# Patient Record
Sex: Male | Born: 1995 | Hispanic: Yes | Marital: Single | Smoking: Never smoker
Health system: Southern US, Community
[De-identification: ages and names within clinical notes are randomized; demographics above are authoritative.]

---
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
## 2017-06-14 ENCOUNTER — Other Ambulatory Visit: Payer: Self-pay

## 2017-06-14 ENCOUNTER — Emergency Department
Admission: EM | Admit: 2017-06-14 | Discharge: 2017-06-14 | Disposition: A | Discharge status: Home / Self Care | Payer: Self-pay | Attending: Emergency Medicine | Admitting: Emergency Medicine

## 2017-06-14 ENCOUNTER — Emergency Department: Payer: Self-pay

## 2017-06-14 ENCOUNTER — Encounter: Payer: Self-pay | Admitting: Emergency Medicine

## 2017-06-14 DIAGNOSIS — N50811 Right testicular pain: Secondary | ICD-10-CM | POA: Insufficient documentation

## 2017-06-14 DIAGNOSIS — A549 Gonococcal infection, unspecified: Secondary | ICD-10-CM | POA: Insufficient documentation

## 2017-06-14 DIAGNOSIS — R109 Unspecified abdominal pain: Secondary | ICD-10-CM | POA: Insufficient documentation

## 2017-06-14 LAB — URINALYSIS, COMPLETE (UACMP) WITH MICROSCOPIC
BILIRUBIN URINE: NEGATIVE
GLUCOSE, UA: NEGATIVE mg/dL
Ketones, ur: NEGATIVE mg/dL
Nitrite: POSITIVE — AB
PH: 7 (ref 5.0–8.0)
Protein, ur: NEGATIVE mg/dL
SPECIFIC GRAVITY, URINE: 1.011 (ref 1.005–1.030)
Squamous Epithelial / LPF: NONE SEEN (ref 0–5)

## 2017-06-14 LAB — CHLAMYDIA/NGC RT PCR (ARMC ONLY)
CHLAMYDIA TR: NOT DETECTED
N GONORRHOEAE: DETECTED — AB

## 2017-06-14 LAB — CBC WITH DIFFERENTIAL/PLATELET
Basophils Absolute: 0 10*3/uL (ref 0–0.1)
Basophils Relative: 0 %
EOS ABS: 0.3 10*3/uL (ref 0–0.7)
EOS PCT: 3 %
HCT: 41.7 % (ref 40.0–52.0)
Hemoglobin: 14.6 g/dL (ref 13.0–18.0)
LYMPHS PCT: 26 %
Lymphs Abs: 2.4 10*3/uL (ref 1.0–3.6)
MCH: 29.4 pg (ref 26.0–34.0)
MCHC: 35 g/dL (ref 32.0–36.0)
MCV: 84.1 fL (ref 80.0–100.0)
MONO ABS: 0.9 10*3/uL (ref 0.2–1.0)
MONOS PCT: 10 %
Neutro Abs: 5.4 10*3/uL (ref 1.4–6.5)
Neutrophils Relative %: 61 %
Platelets: 203 10*3/uL (ref 150–440)
RBC: 4.96 MIL/uL (ref 4.40–5.90)
RDW: 13.1 % (ref 11.5–14.5)
WBC: 9 10*3/uL (ref 3.8–10.6)

## 2017-06-14 LAB — COMPREHENSIVE METABOLIC PANEL
ALT: 45 U/L (ref 17–63)
ANION GAP: 10 (ref 5–15)
AST: 37 U/L (ref 15–41)
Albumin: 4.2 g/dL (ref 3.5–5.0)
Alkaline Phosphatase: 73 U/L (ref 38–126)
BUN: 10 mg/dL (ref 6–20)
CO2: 25 mmol/L (ref 22–32)
CREATININE: 0.85 mg/dL (ref 0.61–1.24)
Calcium: 9.3 mg/dL (ref 8.9–10.3)
Chloride: 103 mmol/L (ref 101–111)
Glucose, Bld: 123 mg/dL — ABNORMAL HIGH (ref 65–99)
Potassium: 3.6 mmol/L (ref 3.5–5.1)
SODIUM: 138 mmol/L (ref 135–145)
Total Bilirubin: 0.5 mg/dL (ref 0.3–1.2)
Total Protein: 7.5 g/dL (ref 6.5–8.1)

## 2017-06-14 MED ORDER — CEFTRIAXONE SODIUM 250 MG IJ SOLR
250.0000 mg | Freq: Once | INTRAMUSCULAR | Status: AC
  Administered 2017-06-14: 250 mg via INTRAMUSCULAR
  Filled 2017-06-14: qty 250

## 2017-06-14 MED ORDER — AZITHROMYCIN 500 MG PO TABS
ORAL_TABLET | ORAL | Status: AC
  Filled 2017-06-14: qty 2

## 2017-06-14 MED ORDER — AZITHROMYCIN 1 G PO PACK
1.0000 g | PACK | Freq: Once | ORAL | Status: DC
  Filled 2017-06-14: qty 1

## 2017-06-14 MED ORDER — AZITHROMYCIN 500 MG PO TABS
1000.0000 mg | ORAL_TABLET | Freq: Once | ORAL | Status: AC
  Administered 2017-06-14: 1000 mg via ORAL

## 2017-06-14 NOTE — ED Notes (Signed)
Dr. Brown at bedside

## 2017-06-14 NOTE — ED Provider Notes (Signed)
I assumed care of the patient from Dr. Juliette AlcideMelinda at 1:00 PM.  Gonorrhea chlamydia was pending at that time which was shortly resulted thereafter which revealed that the patient was positive for gonorrhea.  CT scan was also performed which was unremarkable.  I spoke with the patient at length regarding gonorrhea urethritis and stressed the importance that he inform all sexual partners of this diagnosis.  Patient received IM ceftriaxone 250 mg as well as azithromycin 1 g.   Darci CurrentBrown, [place] N, MD 06/14/17 [phone]

## 2017-06-14 NOTE — ED Provider Notes (Signed)
[facility] Emergency Department Provider Note   ____________________________________________   First MD Initiated Contact with Patient 06/14/17 2217     (approximate)  I have reviewed the triage vital signs and the nursing notes.   HISTORY  Chief Complaint Dysuria    HPI Chrisangel Eskenazi is a 22 y.o. male who reports he was at work and went to urinate and had sudden onset of pain in his right testicle and dysuria.  He has had intermittent episodes of pain shooting down from the right side of his lower abdomen into the testicle and continues to have pain in the right groin.  He also has dysuria has had some fever with it.  He does not have any pain in the testicle now there is no swelling there episodes of pain were brief and went away.   History reviewed. No pertinent past medical history.  There are no active problems to display for this patient.   History reviewed. No pertinent surgical history.  Prior to Admission medications   Not on File    Allergies Penicillins  No family history on file.  Social History Social History   Tobacco Use  . Smoking status: Never Smoker  . Smokeless tobacco: Never Used  Substance Use Topics  . Alcohol use: No  . Drug use: Not on file    Review of Systems  Constitutional:  fever/chills Eyes: No visual changes. ENT: No sore throat. Cardiovascular: Denies chest pain. Respiratory: Denies shortness of breath. Gastrointestinal:abdominal pain see HPI.  No nausea, no vomiting.  No diarrhea.  No constipation. Genitourinary: dysuria. Musculoskeletal: Negative for back pain. Skin: Negative for rash. Neurological: Negative for headaches, focal weakness ____________________________________________   PHYSICAL EXAM:  VITAL SIGNS: ED Triage Vitals  Enc Vitals Group     BP 06/14/17 2053 126/83     Pulse Rate 06/14/17 2147 85     Resp 06/14/17 2053 18     Temp 06/14/17 2053 98.3 F (36.8 C)   Temp Source 06/14/17 2053 Oral     SpO2 06/14/17 2053 97 %     Weight 06/14/17 2055 160 lb (72.6 kg)     Height 06/14/17 2055 5\' 6"  (1.676 m)     Head Circumference --      Peak Flow --      Pain Score 06/14/17 2053 10     Pain Loc --      Pain Edu? --      Excl. in GC? --     Constitutional: Alert and oriented. Well appearing and in no acute distress. Eyes: Conjunctivae are normal.  Head: Atraumatic. Nose: No congestion/rhinnorhea. Mouth/Throat: Mucous membranes are moist.  No erythema or exudate cardiovascular: Normal rate, regular rhythm. Grossly normal heart sounds.  Good peripheral circulation. Respiratory: Normal respiratory effort.  No retractions. Lungs CTAB. Gastrointestinal: Soft and nontender. No distention. No abdominal bruits. No CVA tenderness. Genitourinary: Normal male no discharge no testicular pain or swelling normal lie of the testicles.  There is pain in the right groin but I do not feel any lymphadenopathy there Musculoskeletal: No lower extremity tenderness nor edema.  No joint effusions. Neurologic:  Normal speech and language. No gross focal neurologic deficits are appreciated. No gait instability. Skin:  Skin is warm, dry and intact. No rash noted. Psychiatric: Mood and affect are normal. Speech and behavior are normal.  ____________________________________________   LABS (all labs ordered are listed, but only abnormal results are displayed)  Labs Reviewed  CHLAMYDIA/NGC RT  PCR ([facility] ONLY) - Abnormal; Notable for the following components:      Result Value   N gonorrhoeae DETECTED (*)    All other components within normal limits  URINALYSIS, COMPLETE (UACMP) WITH MICROSCOPIC - Abnormal; Notable for the following components:   Color, Urine AMBER (*)    APPearance HAZY (*)    Hgb urine dipstick MODERATE (*)    Nitrite POSITIVE (*)    Leukocytes, UA MODERATE (*)    RBC / HPF >50 (*)    WBC, UA >50 (*)    Bacteria, UA RARE (*)    All other  components within normal limits  COMPREHENSIVE METABOLIC PANEL - Abnormal; Notable for the following components:   Glucose, Bld 123 (*)    All other components within normal limits  CBC WITH DIFFERENTIAL/PLATELET   ____________________________________________  EKG   ____________________________________________  RADIOLOGY  ED MD interpretation:    Official radiology report(s): Ct Renal Stone Study  Result Date: 06/14/2017 CLINICAL DATA:  Initial evaluation for acute flank pain, stone disease suspected. EXAM: CT ABDOMEN AND PELVIS WITHOUT CONTRAST TECHNIQUE: Multidetector CT imaging of the abdomen and pelvis was performed following the standard protocol without IV contrast. COMPARISON:  Prior CT from 09/23/2014. FINDINGS: Lower chest: Visualized lung bases are clear. Hepatobiliary: Liver demonstrates a normal unenhanced appearance. Gallbladder contracted without acute abnormality. No biliary dilatation. Pancreas: Pancreas within normal limits. Spleen: Spleen within normal limits. Adrenals/Urinary Tract: Adrenal glands are normal. Kidneys equal in size without evidence for nephrolithiasis or hydronephrosis. No radiopaque calculi seen along the course of either renal collecting system. No hydroureter. No layering stones seen within the bladder lumen. Bladder otherwise unremarkable. Stomach/Bowel: Stomach within normal limits. No evidence for bowel obstruction. Normal appendix. No acute inflammatory changes seen about the bowels. Vascular/Lymphatic: Intra-abdominal aorta of normal caliber. No adenopathy. Reproductive: Prostate normal. Other: No free air or fluid. Musculoskeletal: No acute osseous abnormality. No worrisome lytic or blastic osseous lesions. IMPRESSION: 1. No CT evidence for nephrolithiasis or obstructive uropathy. 2. No other acute intra-abdominal or pelvic process identified. Electronically Signed   By: Rise MuBenjamin  McClintock M.D.   On: 06/14/2017 23:07     ____________________________________________   PROCEDURES  Procedure(s) performed:   Procedures    ____________________________________________   INITIAL IMPRESSION / ASSESSMENT AND PLAN / ED COURSE  Discussed with mom that this could be an infection although with the sudden onset of the pain and recurrent shooting of the pain and also could be a kidney stone there is a lot of blood in the urine as well.  I told her to get an ultrasound and a KUB which she asked for CT scan.  She is aware of the cancer risk.  She would rather know and I cannot falter for this I will go ahead and get the CT scan.   patient signed out to Dr Manson PasseyBrown   ____________________________________________   FINAL CLINICAL IMPRESSION(S) / ED DIAGNOSES  Final diagnoses:  Abdominal pain, unspecified abdominal location     ED Discharge Orders    None       Note:  This document was prepared using Dragon voice recognition software and may include unintentional dictation errors.    Arnaldo NatalMalinda, Dalessandro Baldyga F, MD 06/14/17 2311

## 2017-06-14 NOTE — ED Notes (Signed)
Pt reported that on Saturday he had pain in his right testicle. By Monday it progressed to pain with urination and he also had fevers. Pt alert and oriented x 4. Family at bedside.

## 2017-06-14 NOTE — ED Triage Notes (Addendum)
Patient ambulatory to triage with steady gait, without difficulty or distress noted; pt reports since Monday having dysuria with lower back pain and right groin pain; denies any abd pain; taking OTC uristat without relief; denies any penile discharge

## 2019-07-10 IMAGING — CT CT RENAL STONE PROTOCOL
3 of 4 series · 8 of 46 positions shown, 15 images · non-contrast
Comparison: Prior CT from 09/23/2014.

CLINICAL DATA: Initial evaluation for acute flank pain, stone
disease suspected.

EXAM:
CT ABDOMEN AND PELVIS WITHOUT CONTRAST
TECHNIQUE: Multidetector CT imaging of the abdomen and pelvis was performed
following the standard protocol without IV contrast.

[Series 4: lung bases · axial · 0.74mm/px · z∈[-33,+27]mm · 4 of 20 slices shown, 9 images]
[im 4/20  soft-tissue]
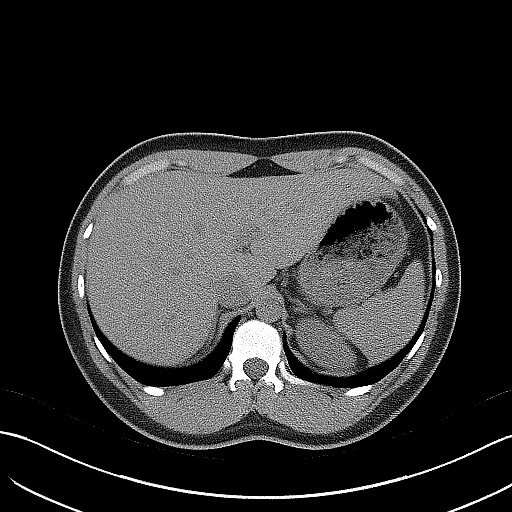
[im 4/20  lung]
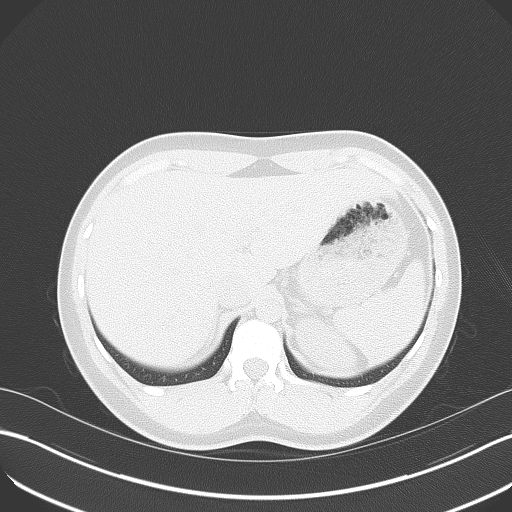
[im 4/20  bone]
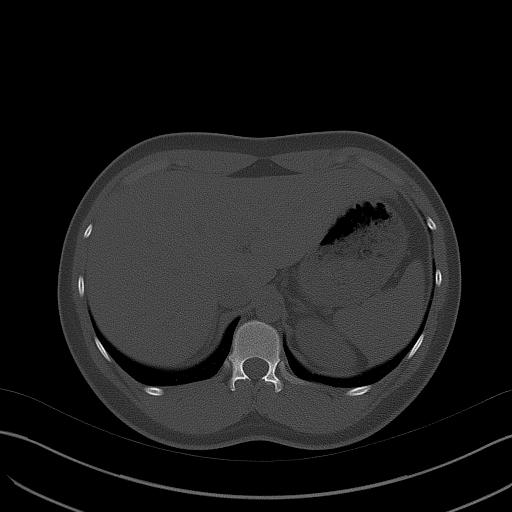
[im 8/20  soft-tissue]
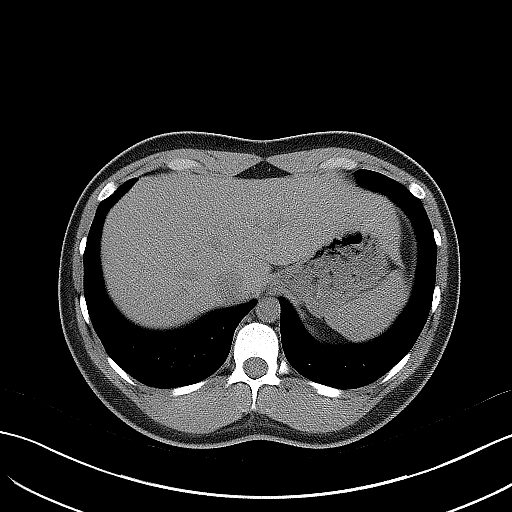
[im 8/20  lung]
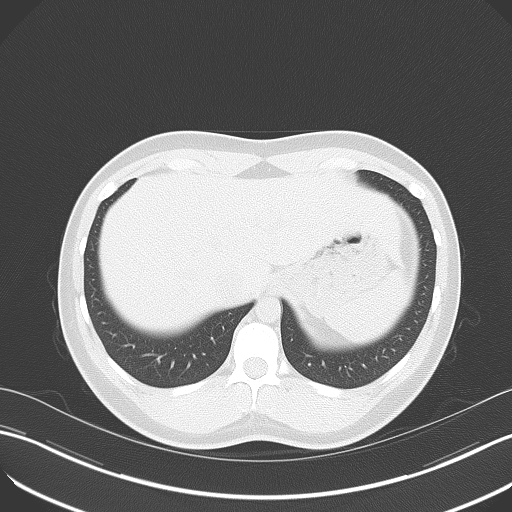
[im 12/20  soft-tissue]
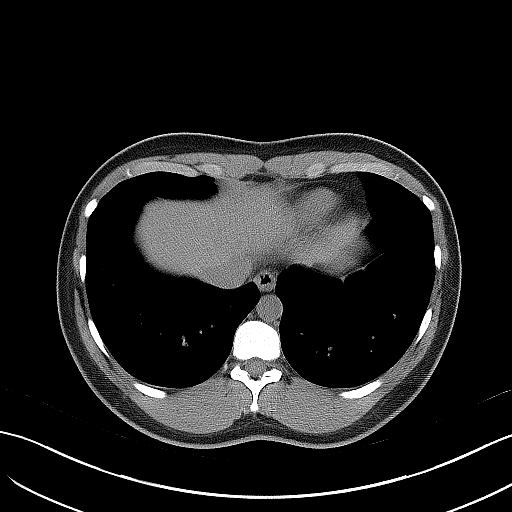
[im 12/20  lung]
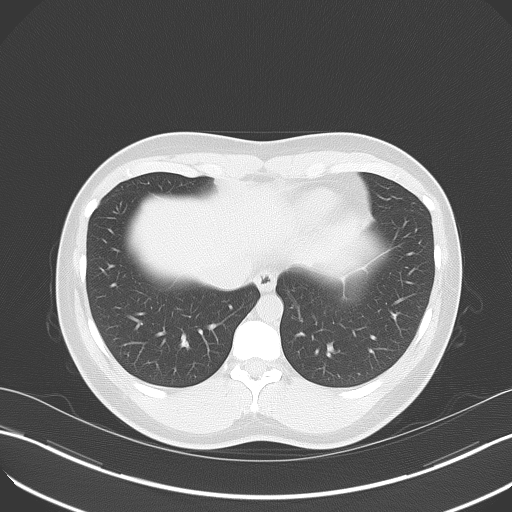
[im 16/20  soft-tissue]
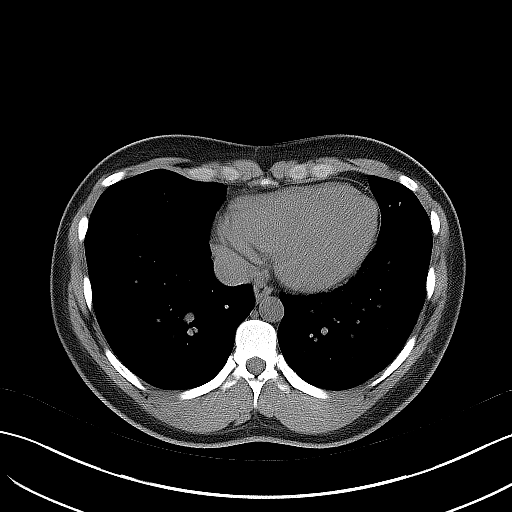
[im 16/20  lung]
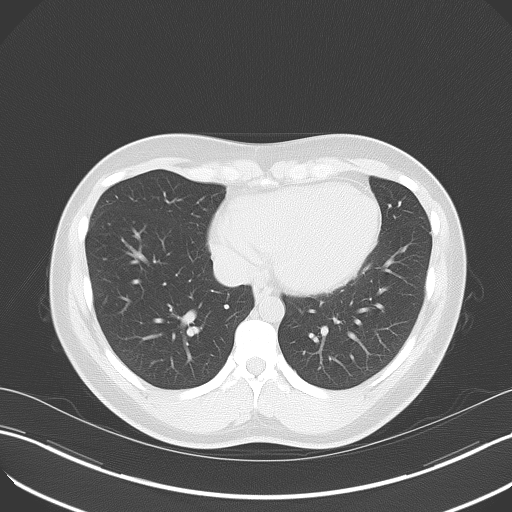

[Series 5: coronal · coronal · 0.73mm/px · 3 of 114 slices shown, 4 images]
[im 38/114  soft-tissue]
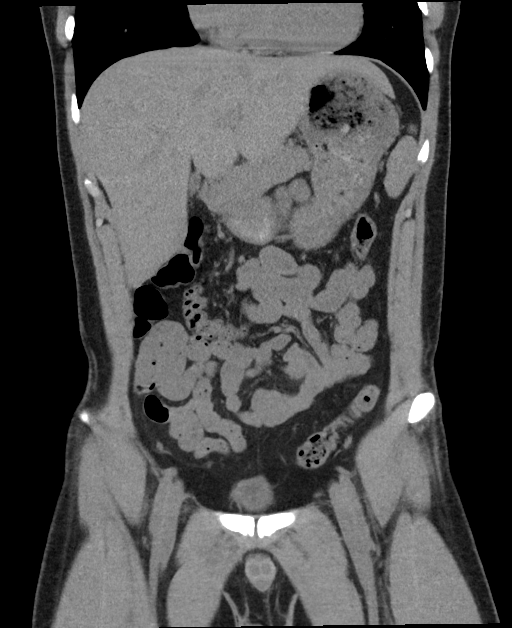
[im 51/114  soft-tissue]
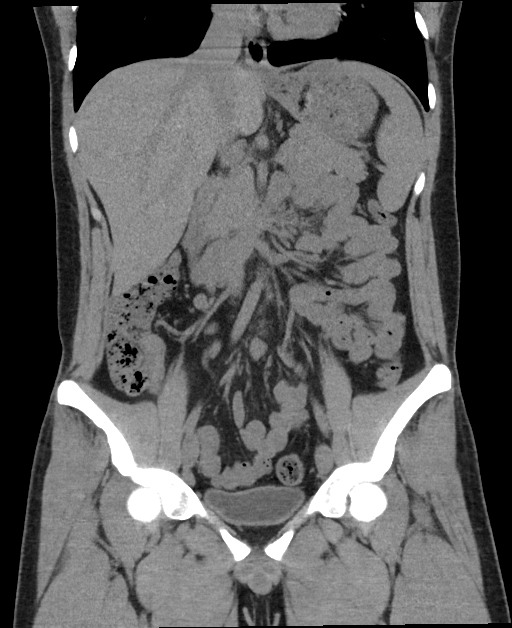
[im 51/114  bone]
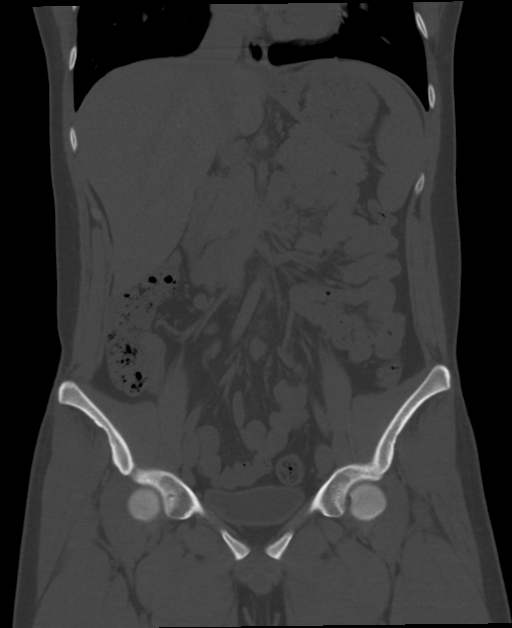
[im 63/114  soft-tissue]
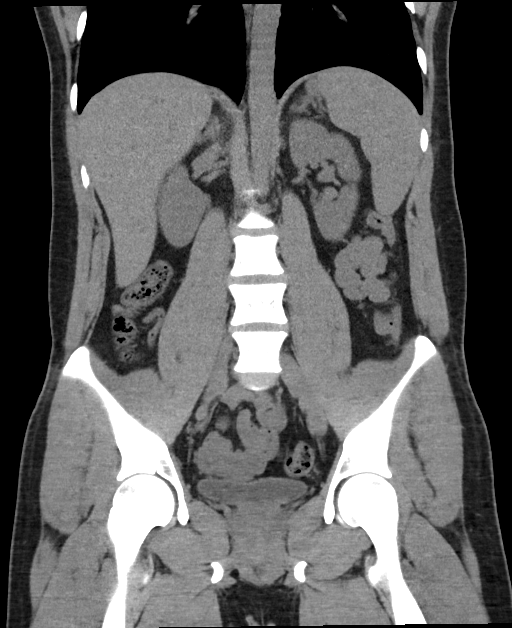

[Series 6: sagittal · sagittal · 0.48mm/px · 1 of 157 slices shown, 2 images]
[im 53/157  soft-tissue]
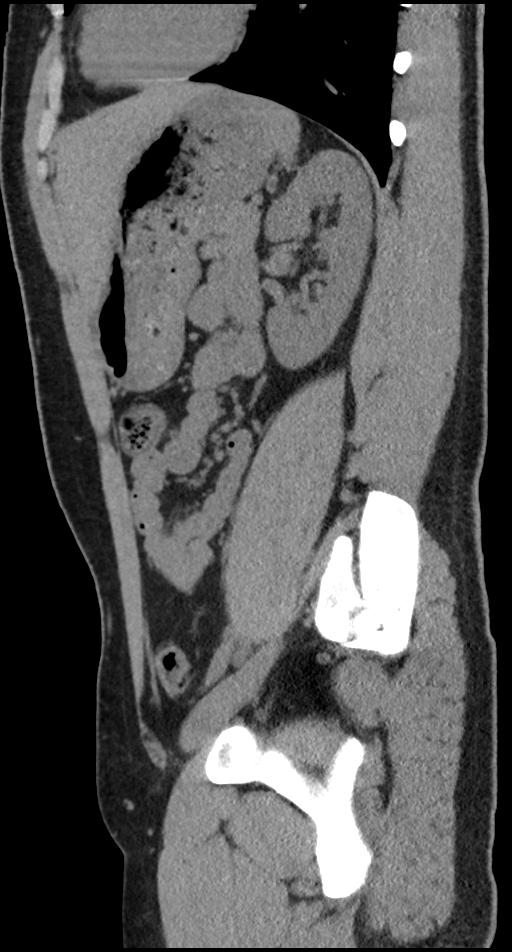
[im 53/157  bone]
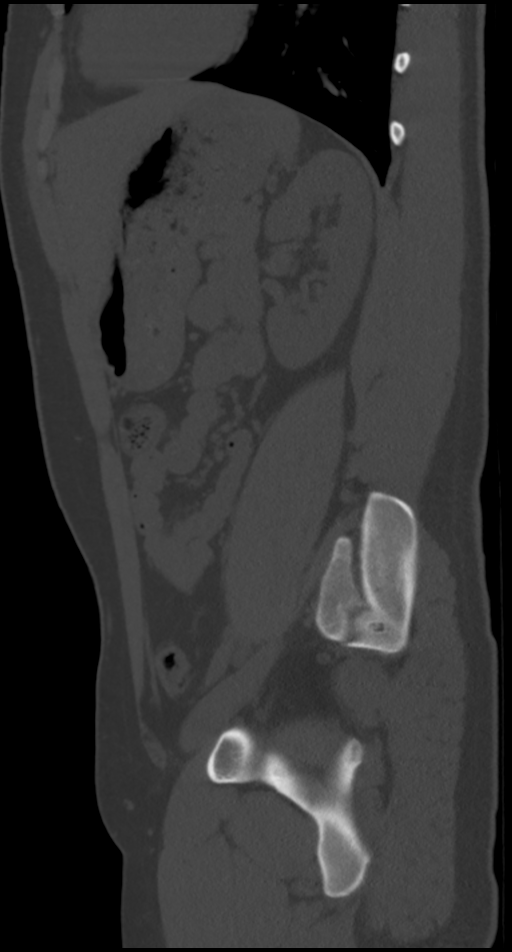

[8 of 46 positions shown; findings below may reference images not displayed]

FINDINGS: Lower chest: Visualized lung bases are clear.

Hepatobiliary: Liver demonstrates a normal unenhanced appearance.
Gallbladder contracted without acute abnormality. No biliary
dilatation.

Pancreas: Pancreas within normal limits.

Spleen: Spleen within normal limits.

Adrenals/Urinary Tract: Adrenal glands are normal. Kidneys equal in
size without evidence for nephrolithiasis or hydronephrosis. No
radiopaque calculi seen along the course of either renal collecting
system. No hydroureter. No layering stones seen within the bladder
lumen. Bladder otherwise unremarkable.

Stomach/Bowel: Stomach within normal limits. No evidence for bowel
obstruction. Normal appendix. No acute inflammatory changes seen
about the bowels.

Vascular/Lymphatic: Intra-abdominal aorta of normal caliber. No
adenopathy.

Reproductive: Prostate normal.

Other: No free air or fluid.

Musculoskeletal: No acute osseous abnormality. No worrisome lytic or
blastic osseous lesions.
IMPRESSION: 1. No CT evidence for nephrolithiasis or obstructive uropathy.
2. No other acute intra-abdominal or pelvic process identified.
